# Patient Record
Sex: Male | Born: 1965 | Race: White | Hispanic: No | Marital: Married | Smoking: Never smoker
Health system: Southern US, Community
[De-identification: ages and names within clinical notes are randomized; demographics above are authoritative.]

## PROBLEM LIST (undated history)

## (undated) DIAGNOSIS — E785 Hyperlipidemia, unspecified: Secondary | ICD-10-CM

## (undated) DIAGNOSIS — I10 Essential (primary) hypertension: Secondary | ICD-10-CM

## (undated) HISTORY — PX: COLONOSCOPY: SHX174

---

## 2012-07-12 ENCOUNTER — Ambulatory Visit: Payer: Self-pay | Admitting: Gastroenterology

## 2012-08-11 ENCOUNTER — Ambulatory Visit: Payer: Self-pay | Admitting: Gastroenterology

## 2012-08-14 LAB — PATHOLOGY REPORT

## 2014-03-06 IMAGING — US ABDOMEN ULTRASOUND LIMITED
1 series · 14 of 25 positions shown · non-contrast
Comparison: none

REASON FOR EXAM: Abn LFT
COMMENTS:

[Series 1: abdomen ultrasound limited · 0.30mm/px · 14 of 66 slices shown]
[im 1/66]
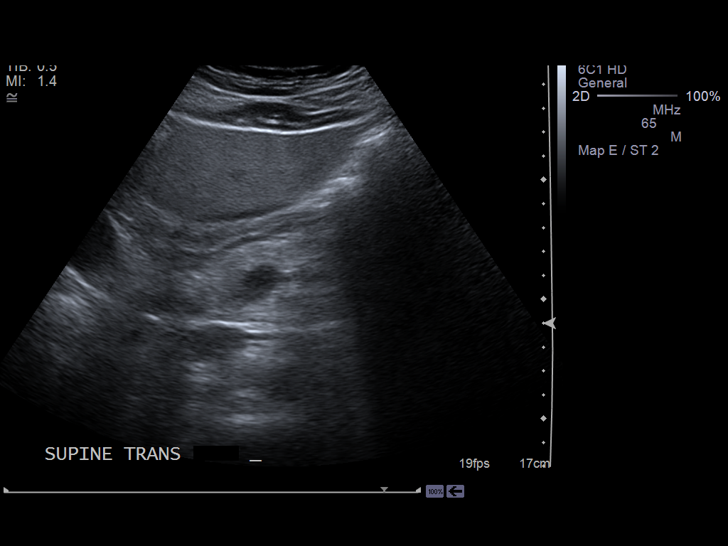
[im 6/66]
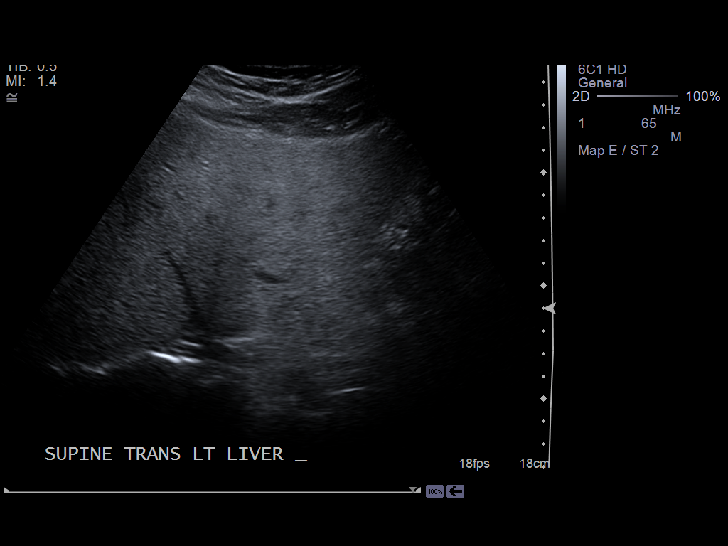
[im 11/66]
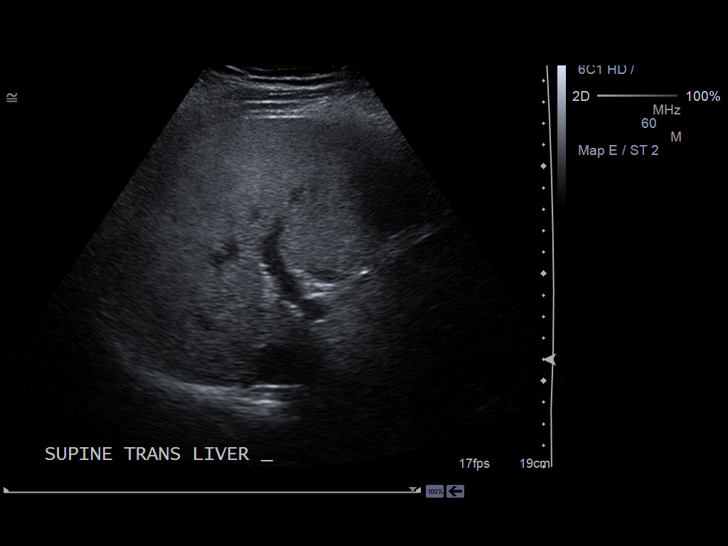
[im 17/66]
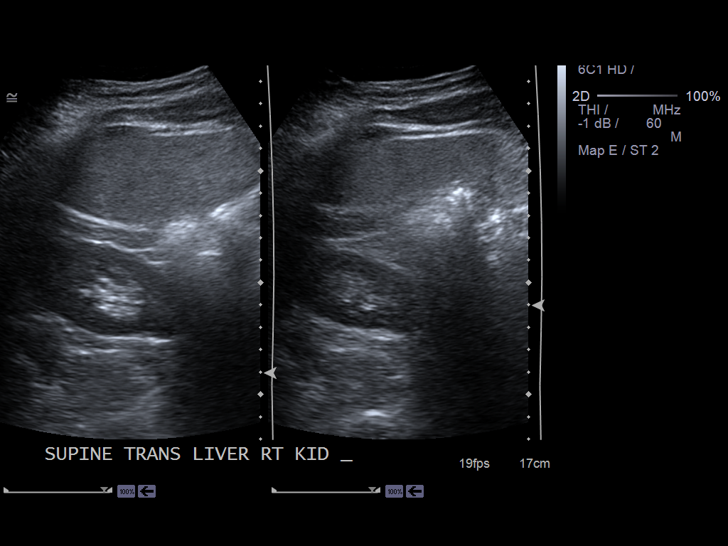
[im 22/66]
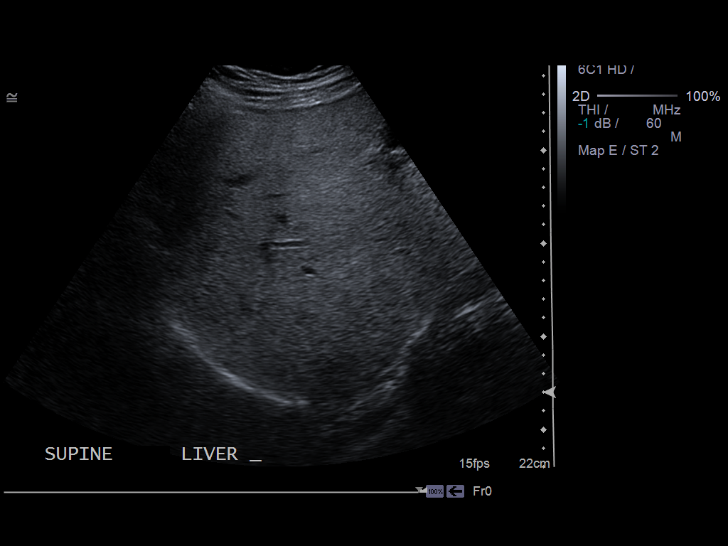
[im 25/66]
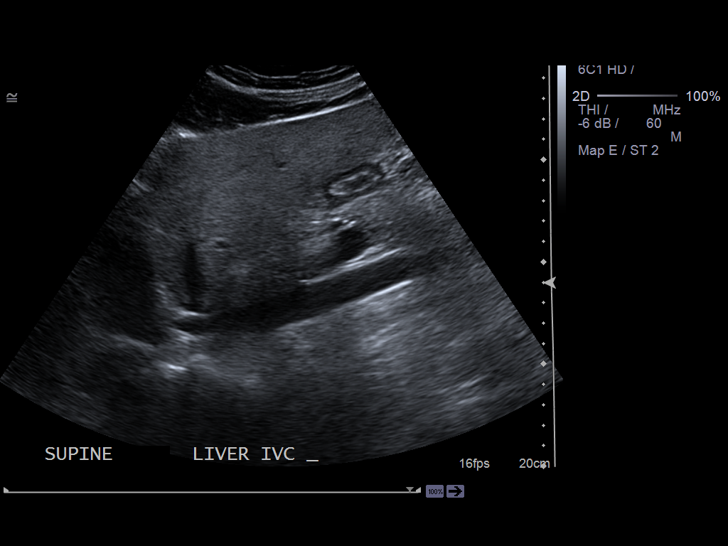
[im 30/66]
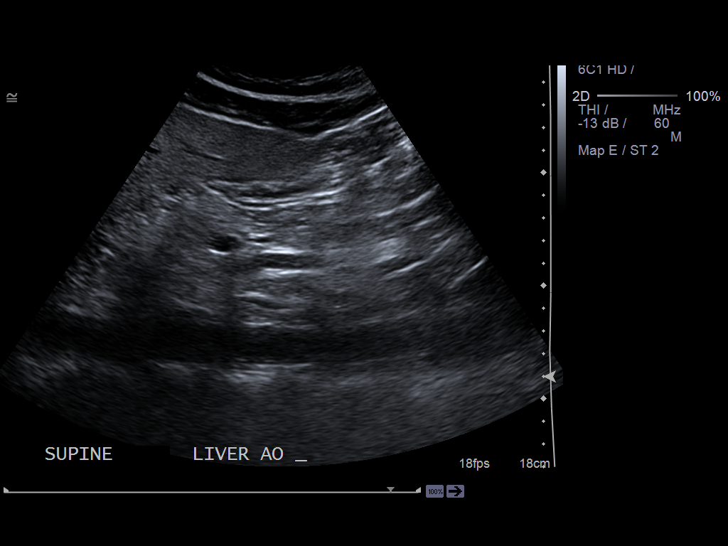
[im 36/66]
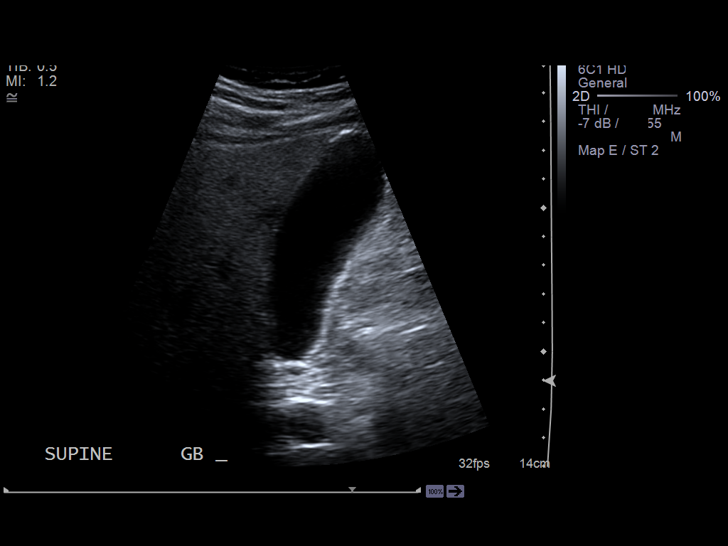
[im 41/66]
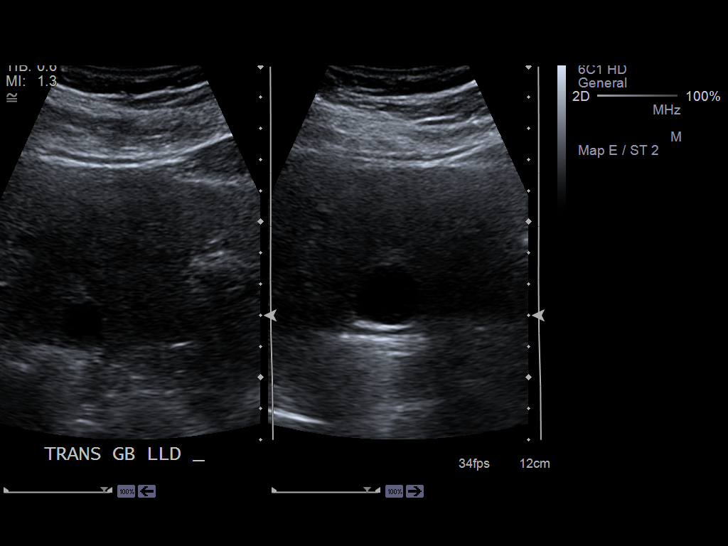
[im 44/66]
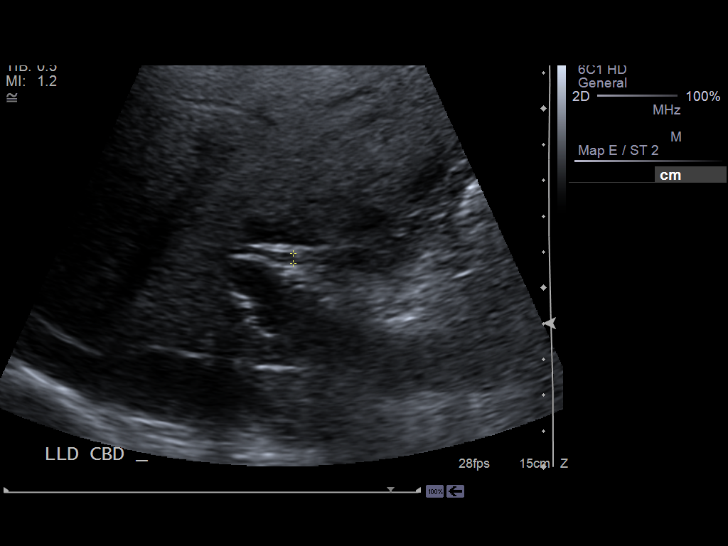
[im 49/66]
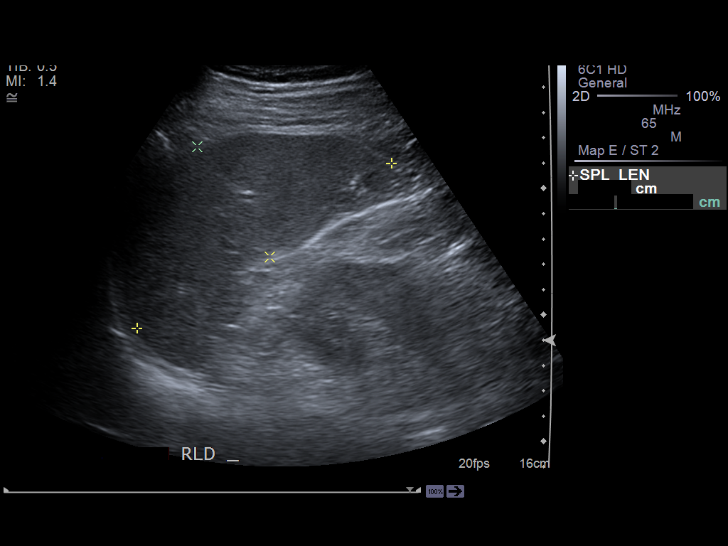
[im 55/66]
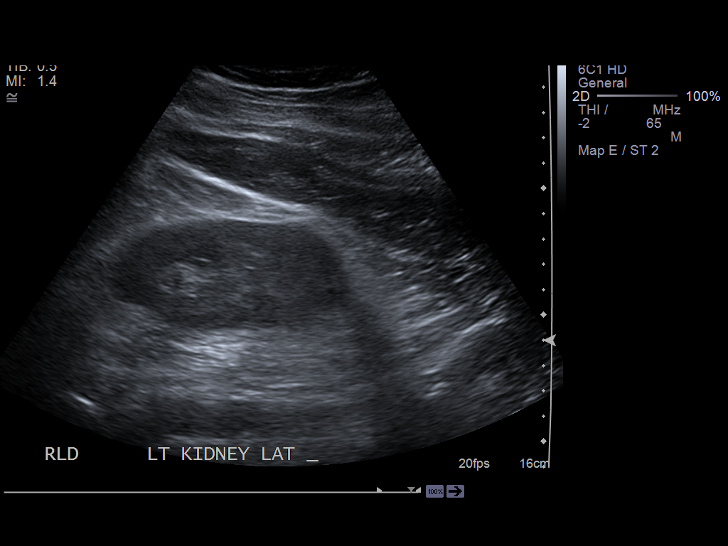
[im 60/66]
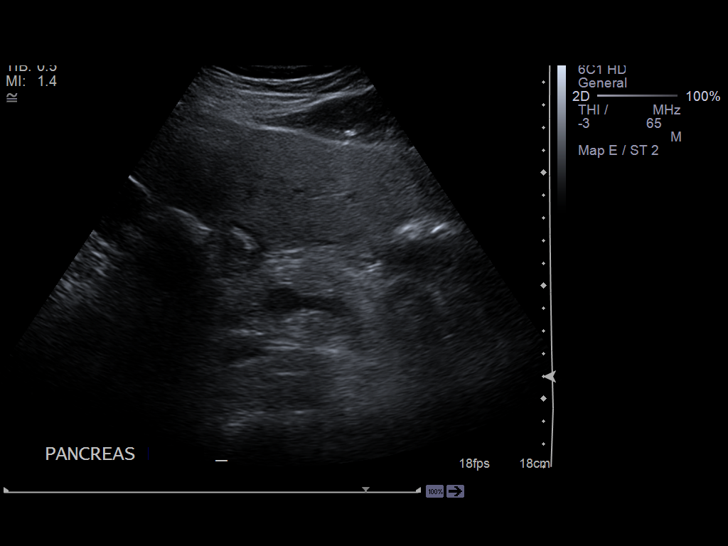
[im 66/66]
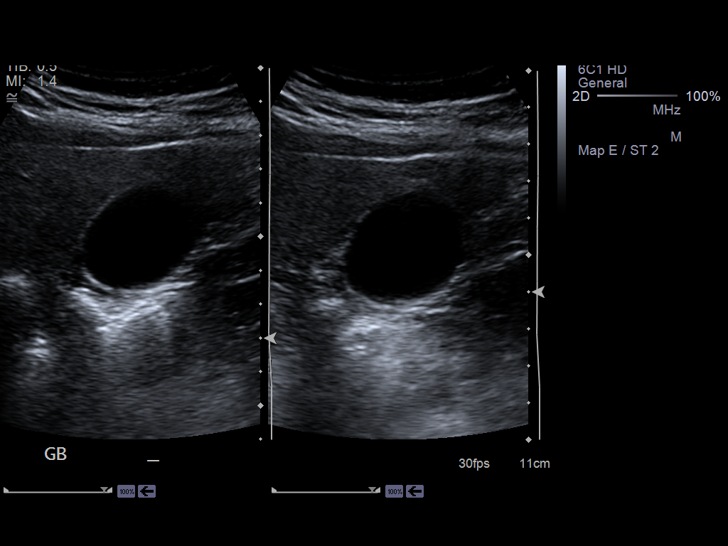

[14 of 25 positions shown; findings below may reference images not displayed]

PROCEDURE:     NAZARETH - NAZARETH ABDOMEN UPPER GENERAL  - July 12, 2012  [DATE]

RESULT:     The liver exhibits mildly increased echotexture suggesting fatty
infiltration. Portal venous flow is normal in direction toward the liver.
Bowel gas limits evaluation of the pancreatic tail. The pancreatic head and
body exhibit normal echotexture with no focal mass or ductal dilation. The
gallbladder is adequately distended with no evidence of stones, wall
thickening, or pericholecystic fluid. There is no positive sonographic
Murphy's sign. The common bile duct is normal at just under 3 mm in diameter.

The spleen is top normal in size at 12 cm. The abdominal aorta, inferior
vena cava, and kidneys are normal in appearance.
IMPRESSION: 1. There are fatty infiltrative changes of the liver.
2. There is no acute abnormality of the gallbladder, visualized portions of
the pancreas, or common bile duct.
3. The spleen is top normal in size at 12 cm in greatest dimension.
4. The kidneys exhibit no acute abnormality.
5. There is no evidence of ascites.

[REDACTED]

## 2015-07-04 ENCOUNTER — Encounter: Payer: Self-pay | Admitting: *Deleted

## 2015-07-07 ENCOUNTER — Ambulatory Visit: Payer: Managed Care, Other (non HMO) | Admitting: Anesthesiology

## 2015-07-07 ENCOUNTER — Ambulatory Visit
Admission: RE | Admit: 2015-07-07 | Discharge: 2015-07-07 | Disposition: A | Discharge status: Home / Self Care | Payer: Managed Care, Other (non HMO) | Source: Ambulatory Visit | Attending: Gastroenterology | Admitting: Gastroenterology

## 2015-07-07 ENCOUNTER — Encounter
Admission: RE | Disposition: A | Discharge status: Home / Self Care | Payer: Self-pay | Source: Ambulatory Visit | Attending: Gastroenterology

## 2015-07-07 DIAGNOSIS — Z1211 Encounter for screening for malignant neoplasm of colon: Secondary | ICD-10-CM | POA: Insufficient documentation

## 2015-07-07 DIAGNOSIS — E785 Hyperlipidemia, unspecified: Secondary | ICD-10-CM | POA: Diagnosis not present

## 2015-07-07 DIAGNOSIS — Z79899 Other long term (current) drug therapy: Secondary | ICD-10-CM | POA: Insufficient documentation

## 2015-07-07 DIAGNOSIS — D124 Benign neoplasm of descending colon: Secondary | ICD-10-CM | POA: Insufficient documentation

## 2015-07-07 DIAGNOSIS — Z880 Allergy status to penicillin: Secondary | ICD-10-CM | POA: Diagnosis not present

## 2015-07-07 DIAGNOSIS — D125 Benign neoplasm of sigmoid colon: Secondary | ICD-10-CM | POA: Diagnosis not present

## 2015-07-07 DIAGNOSIS — I10 Essential (primary) hypertension: Secondary | ICD-10-CM | POA: Diagnosis not present

## 2015-07-07 DIAGNOSIS — K5730 Diverticulosis of large intestine without perforation or abscess without bleeding: Secondary | ICD-10-CM | POA: Diagnosis not present

## 2015-07-07 DIAGNOSIS — K621 Rectal polyp: Secondary | ICD-10-CM | POA: Insufficient documentation

## 2015-07-07 DIAGNOSIS — Z800 Family history of malignant neoplasm of digestive organs: Secondary | ICD-10-CM | POA: Diagnosis not present

## 2015-07-07 DIAGNOSIS — Z86010 Personal history of colonic polyps: Secondary | ICD-10-CM | POA: Insufficient documentation

## 2015-07-07 HISTORY — DX: Essential (primary) hypertension: I10

## 2015-07-07 HISTORY — PX: COLONOSCOPY WITH PROPOFOL: SHX5780

## 2015-07-07 HISTORY — DX: Hyperlipidemia, unspecified: E78.5

## 2015-07-07 SURGERY — COLONOSCOPY WITH PROPOFOL
Anesthesia: General

## 2015-07-07 MED ORDER — SODIUM CHLORIDE 0.9 % IV SOLN
INTRAVENOUS | Status: DC
  Administered 2015-07-07: 14:00:00 via INTRAVENOUS
  Administered 2015-07-07: 1000 mL via INTRAVENOUS

## 2015-07-07 MED ORDER — MIDAZOLAM HCL 5 MG/5ML IJ SOLN
INTRAMUSCULAR | Status: DC | PRN
  Administered 2015-07-07: 2 mg via INTRAVENOUS

## 2015-07-07 MED ORDER — PROPOFOL 10 MG/ML IV BOLUS
INTRAVENOUS | Status: DC | PRN
  Administered 2015-07-07 (×2): 50 mg via INTRAVENOUS

## 2015-07-07 MED ORDER — LIDOCAINE HCL (PF) 1 % IJ SOLN
2.0000 mL | Freq: Once | INTRAMUSCULAR | Status: AC
  Administered 2015-07-07: 0.03 mL via INTRADERMAL

## 2015-07-07 MED ORDER — PROPOFOL 500 MG/50ML IV EMUL
INTRAVENOUS | Status: DC | PRN
  Administered 2015-07-07: 150 ug/kg/min via INTRAVENOUS

## 2015-07-07 MED ORDER — SODIUM CHLORIDE 0.9 % IV SOLN: INTRAVENOUS | Status: DC

## 2015-07-07 MED ORDER — FENTANYL CITRATE (PF) 100 MCG/2ML IJ SOLN
INTRAMUSCULAR | Status: DC | PRN
  Administered 2015-07-07: 50 ug via INTRAVENOUS

## 2015-07-07 MED ORDER — LIDOCAINE HCL (PF) 1 % IJ SOLN
INTRAMUSCULAR | Status: AC
  Administered 2015-07-07: 0.03 mL via INTRADERMAL
  Filled 2015-07-07: qty 5

## 2015-07-07 MED ORDER — LIDOCAINE 2% (20 MG/ML) 5 ML SYRINGE
INTRAMUSCULAR | Status: DC | PRN
  Administered 2015-07-07: 40 mg via INTRAVENOUS

## 2015-07-07 NOTE — Transfer of Care (Signed)
Immediate Anesthesia Transfer of Care Note  Patient: Edward Case  Procedure(s) Performed: Procedure(s): COLONOSCOPY WITH PROPOFOL (N/A)  Patient Location: PACU  Anesthesia Type:General  Level of Consciousness: sedated  Airway & Oxygen Therapy: Patient Spontanous Breathing and Patient connected to nasal cannula oxygen  Post-op Assessment: Report given to RN and Post -op Vital signs reviewed and stable  Post vital signs: Reviewed and stable  Last Vitals:  Filed Vitals:   07/07/15 1257  BP: 151/97  Pulse: 77  Temp: 36.2 C  Resp: 17    Last Pain: There were no vitals filed for this visit.       Complications: No apparent anesthesia complications

## 2015-07-07 NOTE — Anesthesia Postprocedure Evaluation (Signed)
Anesthesia Post Note  Patient: Edward Case  Procedure(s) Performed: Procedure(s) (LRB): COLONOSCOPY WITH PROPOFOL (N/A)  Patient location during evaluation: PACU Anesthesia Type: General Level of consciousness: awake and alert Pain management: pain level controlled Vital Signs Assessment: post-procedure vital signs reviewed and stable Respiratory status: spontaneous breathing, nonlabored ventilation, respiratory function stable and patient connected to nasal cannula oxygen Cardiovascular status: blood pressure returned to baseline and stable Postop Assessment: no signs of nausea or vomiting Anesthetic complications: no    Last Vitals:  Filed Vitals:   07/07/15 1450 07/07/15 1500  BP: 133/86 138/94  Pulse: 62 64  Temp:    Resp: 21 18    Last Pain: There were no vitals filed for this visit.               Cassadi Purdie

## 2015-07-07 NOTE — Op Note (Signed)
[facility] Gastroenterology Patient Name: Edward Case Procedure Date: 07/07/2015 1:57 PM MRN: [id number] Account #: [id number] Date of Birth: March 28, 1965 Admit Type: Outpatient Age: 50 Room: [facility] ENDO ROOM 3 Gender: Male Note Status: Finalized Procedure:            Colonoscopy Indications:          Family history of colon cancer in a first-degree                        relative, Personal history of colonic polyps Providers:            Lollie Sails, MD Referring MD:         Leonie Douglas. Doy Hutching, MD (Referring MD) Medicines:            Monitored Anesthesia Care Complications:        No immediate complications. Procedure:            Pre-Anesthesia Assessment:                       - ASA Grade Assessment: II - A patient with mild                        systemic disease.                       After obtaining informed consent, the colonoscope was                        passed under direct vision. Throughout the procedure,                        the patient's blood pressure, pulse, and oxygen                        saturations were monitored continuously. The                        Colonoscope was introduced through the anus and                        advanced to the the cecum, identified by appendiceal                        orifice and ileocecal valve. The colonoscopy was                        performed without difficulty. The patient tolerated the                        procedure well. The quality of the bowel preparation                        was good except the ascending colon was fair. Findings:      A 2 mm polyp was found in the distal descending colon. The polyp was       sessile. The polyp was removed with a cold biopsy forceps. Resection and       retrieval were complete.      A 18 mm polyp was found in the distal sigmoid colon. The polyp was       pedunculated.  The polyp was removed with a cold snare. Resection and       retrieval were complete. To  prevent bleeding after the polypectomy, one       hemostatic clip was successfully placed. There was no bleeding at the       end of the maneuver.      A 3 mm polyp was found in the sigmoid colon distal sigmoid colon. The       polyp was sessile. The polyp was removed with a cold biopsy forceps.       Resection and retrieval were complete.      A 3 mm polyp was found in the rectum. The polyp was sessile. The polyp       was removed with a cold biopsy forceps. Resection and retrieval were       complete.      The digital rectal exam was normal.      The retroflexed view of the distal rectum and anal verge was normal and       showed no anal or rectal abnormalities.      The digital rectal exam was normal.      A few small-mouthed diverticula were found in the sigmoid colon and       distal descending colon. Impression:           - One 2 mm polyp in the distal descending colon,                        removed with a cold biopsy forceps. Resected and                        retrieved.                       - One 18 mm polyp in the distal sigmoid colon, removed                        with a cold snare. Resected and retrieved. Clip was                        placed.                       - One 3 mm polyp in the sigmoid colon in the distal                        sigmoid colon, removed with a cold biopsy forceps.                        Resected and retrieved.                       - One 3 mm polyp in the rectum, removed with a cold                        biopsy forceps. Resected and retrieved.                       - The distal rectum and anal verge are normal on                        retroflexion view. Recommendation:       -  Discharge patient to home.                       - Telephone GI clinic for pathology results in 1 week. Procedure Code(s):    --- Professional ---                       [phone], Colonoscopy, flexible; with removal of tumor(s),                        polyp(s), or other  lesion(s) by snare technique                       [id number], 60, Colonoscopy, flexible; with biopsy, single                        or multiple Diagnosis Code(s):    --- Professional ---                       D12.4, Benign neoplasm of descending colon                       D12.5, Benign neoplasm of sigmoid colon                       K62.1, Rectal polyp                       Z80.0, Family history of malignant neoplasm of                        digestive organs                       Z86.010, Personal history of colonic polyps CPT copyright 2016 [employer]. All rights reserved. The codes documented in this report are preliminary and upon coder review may  be revised to meet current compliance requirements. Lollie Sails, MD 07/07/2015 2:30:27 PM This report has been signed electronically. Number of Addenda: 0 Note Initiated On: 07/07/2015 1:57 PM Scope Withdrawal Time: 0 hours 13 minutes 17 seconds  Total Procedure Duration: 0 hours 21 minutes 18 seconds       [facility]

## 2015-07-07 NOTE — H&P (Signed)
Outpatient short stay form Pre-procedure 07/07/2015 1:43 PM Edward Sails MD  Primary Physician: Dr. Fulton Reek  Reason for visit:  Colonoscopy  History of present illness:  Patient is a 50 year old male presenting today for colonoscopy. There is a personal history of adenomatous colon polyps as well as a family history of colon cancer in primary relatives. He tolerated his prep well. He denies use of any recent aspirin or NSAID products and does not take a prescribed blood thinner.    Current facility-administered medications:  .  0.9 %  sodium chloride infusion, , Intravenous, Continuous, Edward Sails, MD, Last Rate: 20 mL/hr at 07/07/15 1321, 1,000 mL at 07/07/15 1321 .  0.9 %  sodium chloride infusion, , Intravenous, Continuous, Edward Sails, MD  Prescriptions prior to admission  Medication Sig Dispense Refill Last Dose  . losartan (COZAAR) 100 MG tablet Take 100 mg by mouth daily.     . Multiple Vitamin (MULTIVITAMIN) tablet Take 1 tablet by mouth daily.        Allergies  Allergen Reactions  . Augmentin [Amoxicillin-Pot Clavulanate] Rash     Past Medical History  Diagnosis Date  . Hypertension   . Dyslipidemia     Review of systems:      Physical Exam    Heart and lungs: Regular rate and rhythm without rub or gallop, lungs are bilaterally clear.    HEENT: Normocephalic atraumatic eyes are anicteric    Other:     Pertinant exam for procedure: Soft nontender nondistended bowel sounds positive normoactive.    Planned proceedures: Colonoscopy and indicated procedures. I have discussed the risks benefits and complications of procedures to include not limited to bleeding, infection, perforation and the risk of sedation and the patient wishes to proceed.    Edward Sails, MD Gastroenterology 07/07/2015  1:43 PM

## 2015-07-07 NOTE — Anesthesia Preprocedure Evaluation (Signed)
Anesthesia Evaluation  Patient identified by MRN, date of birth, ID band Patient awake    Reviewed: Allergy & Precautions, H&P , NPO status , Patient's Chart, lab work & pertinent test results, reviewed documented beta blocker date and time   Airway Mallampati: II   Neck ROM: full    Dental  (+) Poor Dentition   Pulmonary neg pulmonary ROS,    Pulmonary exam normal        Cardiovascular hypertension, negative cardio ROS Normal cardiovascular exam     Neuro/Psych negative neurological ROS  negative psych ROS   GI/Hepatic negative GI ROS, Neg liver ROS,   Endo/Other  negative endocrine ROS  Renal/GU negative Renal ROS  negative genitourinary   Musculoskeletal   Abdominal   Peds  Hematology negative hematology ROS (+)   Anesthesia Other Findings Past Medical History:   Hypertension                                                 Dyslipidemia                                               Past Surgical History:   COLONOSCOPY                                                 BMI    Body Mass Index   30.41 kg/m 2     Reproductive/Obstetrics                             Anesthesia Physical Anesthesia Plan  ASA: II  Anesthesia Plan: General   Post-op Pain Management:    Induction:   Airway Management Planned:   Additional Equipment:   Intra-op Plan:   Post-operative Plan:   Informed Consent: I have reviewed the patients History and Physical, chart, labs and discussed the procedure including the risks, benefits and alternatives for the proposed anesthesia with the patient or authorized representative who has indicated his/her understanding and acceptance.   Dental Advisory Given  Plan Discussed with: CRNA  Anesthesia Plan Comments:         Anesthesia Quick Evaluation

## 2015-07-07 NOTE — Anesthesia Procedure Notes (Signed)
Date/Time: 07/07/2015 2:00 PM Performed by: Allean Found Pre-anesthesia Checklist: Patient identified, Emergency Drugs available, Suction available, Patient being monitored and Timeout performed Patient Re-evaluated:Patient Re-evaluated prior to inductionOxygen Delivery Method: Nasal cannula Intubation Type: IV induction

## 2015-07-09 ENCOUNTER — Encounter: Payer: Self-pay | Admitting: Gastroenterology

## 2015-07-09 LAB — SURGICAL PATHOLOGY

## 2021-04-01 DIAGNOSIS — I10 Essential (primary) hypertension: Secondary | ICD-10-CM | POA: Diagnosis not present

## 2021-04-01 DIAGNOSIS — Z79899 Other long term (current) drug therapy: Secondary | ICD-10-CM | POA: Diagnosis not present

## 2021-04-01 DIAGNOSIS — E118 Type 2 diabetes mellitus with unspecified complications: Secondary | ICD-10-CM | POA: Diagnosis not present

## 2021-11-09 DIAGNOSIS — Z0000 Encounter for general adult medical examination without abnormal findings: Secondary | ICD-10-CM | POA: Diagnosis not present

## 2021-11-09 DIAGNOSIS — E118 Type 2 diabetes mellitus with unspecified complications: Secondary | ICD-10-CM | POA: Diagnosis not present

## 2021-11-09 DIAGNOSIS — E782 Mixed hyperlipidemia: Secondary | ICD-10-CM | POA: Diagnosis not present

## 2021-11-09 DIAGNOSIS — R748 Abnormal levels of other serum enzymes: Secondary | ICD-10-CM | POA: Diagnosis not present

## 2021-11-09 DIAGNOSIS — Z79899 Other long term (current) drug therapy: Secondary | ICD-10-CM | POA: Diagnosis not present

## 2021-11-09 DIAGNOSIS — Z125 Encounter for screening for malignant neoplasm of prostate: Secondary | ICD-10-CM | POA: Diagnosis not present

## 2021-11-09 DIAGNOSIS — R7989 Other specified abnormal findings of blood chemistry: Secondary | ICD-10-CM | POA: Diagnosis not present

## 2021-11-09 DIAGNOSIS — Z23 Encounter for immunization: Secondary | ICD-10-CM | POA: Diagnosis not present

## 2021-11-09 DIAGNOSIS — I10 Essential (primary) hypertension: Secondary | ICD-10-CM | POA: Diagnosis not present

## 2021-11-12 DIAGNOSIS — E118 Type 2 diabetes mellitus with unspecified complications: Secondary | ICD-10-CM | POA: Diagnosis not present

## 2021-11-12 DIAGNOSIS — R7989 Other specified abnormal findings of blood chemistry: Secondary | ICD-10-CM | POA: Diagnosis not present

## 2021-11-12 DIAGNOSIS — E782 Mixed hyperlipidemia: Secondary | ICD-10-CM | POA: Diagnosis not present

## 2021-11-12 DIAGNOSIS — Z125 Encounter for screening for malignant neoplasm of prostate: Secondary | ICD-10-CM | POA: Diagnosis not present

## 2021-11-12 DIAGNOSIS — Z79899 Other long term (current) drug therapy: Secondary | ICD-10-CM | POA: Diagnosis not present

## 2021-11-12 DIAGNOSIS — I10 Essential (primary) hypertension: Secondary | ICD-10-CM | POA: Diagnosis not present

## 2021-12-08 DIAGNOSIS — Z7984 Long term (current) use of oral hypoglycemic drugs: Secondary | ICD-10-CM | POA: Diagnosis not present

## 2021-12-08 DIAGNOSIS — Z880 Allergy status to penicillin: Secondary | ICD-10-CM | POA: Diagnosis not present

## 2021-12-08 DIAGNOSIS — E119 Type 2 diabetes mellitus without complications: Secondary | ICD-10-CM | POA: Diagnosis not present

## 2021-12-08 DIAGNOSIS — I10 Essential (primary) hypertension: Secondary | ICD-10-CM | POA: Diagnosis not present

## 2022-02-11 DIAGNOSIS — Z79899 Other long term (current) drug therapy: Secondary | ICD-10-CM | POA: Diagnosis not present

## 2022-02-11 DIAGNOSIS — I10 Essential (primary) hypertension: Secondary | ICD-10-CM | POA: Diagnosis not present

## 2022-02-11 DIAGNOSIS — E118 Type 2 diabetes mellitus with unspecified complications: Secondary | ICD-10-CM | POA: Diagnosis not present

## 2024-03-12 ENCOUNTER — Other Ambulatory Visit: Payer: Self-pay | Admitting: Internal Medicine

## 2024-03-12 DIAGNOSIS — E118 Type 2 diabetes mellitus with unspecified complications: Secondary | ICD-10-CM

## 2024-03-12 DIAGNOSIS — E782 Mixed hyperlipidemia: Secondary | ICD-10-CM

## 2024-03-12 DIAGNOSIS — I10 Essential (primary) hypertension: Secondary | ICD-10-CM

## 2024-03-16 ENCOUNTER — Ambulatory Visit
Admission: RE | Admit: 2024-03-16 | Discharge: 2024-03-16 | Disposition: A | Payer: Self-pay | Source: Ambulatory Visit | Attending: Internal Medicine | Admitting: Internal Medicine

## 2024-03-16 DIAGNOSIS — I10 Essential (primary) hypertension: Secondary | ICD-10-CM | POA: Insufficient documentation

## 2024-03-16 DIAGNOSIS — E118 Type 2 diabetes mellitus with unspecified complications: Secondary | ICD-10-CM | POA: Insufficient documentation

## 2024-03-16 DIAGNOSIS — E782 Mixed hyperlipidemia: Secondary | ICD-10-CM | POA: Insufficient documentation
